# Patient Record
Sex: Female | Born: 1995 | Race: Black or African American | Hispanic: No | Marital: Single
Health system: Southern US, Community
[De-identification: ages and names within clinical notes are randomized; demographics above are authoritative.]

---

## 2018-11-10 ENCOUNTER — Emergency Department (HOSPITAL_COMMUNITY): Payer: Medicaid Other

## 2018-11-10 ENCOUNTER — Ambulatory Visit (HOSPITAL_COMMUNITY)
Admission: EM | Admit: 2018-11-10 | Discharge: 2018-11-10 | Disposition: A | Discharge status: Home / Self Care | Payer: Medicaid Other

## 2018-11-10 ENCOUNTER — Encounter (HOSPITAL_COMMUNITY): Payer: Self-pay | Admitting: *Deleted

## 2018-11-10 ENCOUNTER — Other Ambulatory Visit: Payer: Self-pay

## 2018-11-10 ENCOUNTER — Emergency Department (HOSPITAL_COMMUNITY)
Admission: EM | Admit: 2018-11-10 | Discharge: 2018-11-10 | Disposition: A | Discharge status: Home / Self Care | Payer: Medicaid Other | Attending: Emergency Medicine | Admitting: Emergency Medicine

## 2018-11-10 DIAGNOSIS — N390 Urinary tract infection, site not specified: Secondary | ICD-10-CM | POA: Insufficient documentation

## 2018-11-10 DIAGNOSIS — R102 Pelvic and perineal pain: Secondary | ICD-10-CM | POA: Diagnosis present

## 2018-11-10 LAB — CBC WITH DIFFERENTIAL/PLATELET
Abs Immature Granulocytes: 0.01 10*3/uL (ref 0.00–0.07)
BASOS ABS: 0 10*3/uL (ref 0.0–0.1)
Basophils Relative: 1 %
EOS ABS: 0.1 10*3/uL (ref 0.0–0.5)
Eosinophils Relative: 1 %
HCT: 44.4 % (ref 36.0–46.0)
Hemoglobin: 13.7 g/dL (ref 12.0–15.0)
Immature Granulocytes: 0 %
Lymphocytes Relative: 38 %
Lymphs Abs: 2.5 10*3/uL (ref 0.7–4.0)
MCH: 25 pg — ABNORMAL LOW (ref 26.0–34.0)
MCHC: 30.9 g/dL (ref 30.0–36.0)
MCV: 81.2 fL (ref 80.0–100.0)
Monocytes Absolute: 0.4 10*3/uL (ref 0.1–1.0)
Monocytes Relative: 6 %
Neutro Abs: 3.5 10*3/uL (ref 1.7–7.7)
Neutrophils Relative %: 54 %
PLATELETS: 182 10*3/uL (ref 150–400)
RBC: 5.47 MIL/uL — ABNORMAL HIGH (ref 3.87–5.11)
RDW: 19.6 % — AB (ref 11.5–15.5)
WBC: 6.5 10*3/uL (ref 4.0–10.5)
nRBC: 0 % (ref 0.0–0.2)

## 2018-11-10 LAB — COMPREHENSIVE METABOLIC PANEL
ALT: 17 U/L (ref 0–44)
AST: 23 U/L (ref 15–41)
Albumin: 3.9 g/dL (ref 3.5–5.0)
Alkaline Phosphatase: 61 U/L (ref 38–126)
Anion gap: 7 (ref 5–15)
BUN: 10 mg/dL (ref 6–20)
CO2: 25 mmol/L (ref 22–32)
Calcium: 9.4 mg/dL (ref 8.9–10.3)
Chloride: 106 mmol/L (ref 98–111)
Creatinine, Ser: 0.9 mg/dL (ref 0.44–1.00)
GFR calc non Af Amer: >60 mL/min (ref >60)
Glucose, Bld: 103 mg/dL — ABNORMAL HIGH (ref 70–99)
Potassium: 3.9 mmol/L (ref 3.5–5.1)
Sodium: 138 mmol/L (ref 135–145)
Total Bilirubin: 0.6 mg/dL (ref 0.3–1.2)
Total Protein: 6.9 g/dL (ref 6.5–8.1)

## 2018-11-10 LAB — WET PREP, GENITAL
Clue Cells Wet Prep HPF POC: NONE SEEN
Sperm: NONE SEEN
Trich, Wet Prep: NONE SEEN
Yeast Wet Prep HPF POC: NONE SEEN

## 2018-11-10 LAB — URINALYSIS, ROUTINE W REFLEX MICROSCOPIC
Bilirubin Urine: NEGATIVE
GLUCOSE, UA: NEGATIVE mg/dL
Ketones, ur: NEGATIVE mg/dL
Leukocytes, UA: NEGATIVE
Nitrite: NEGATIVE
Protein, ur: NEGATIVE mg/dL
RBC / HPF: >50 RBC/hpf — ABNORMAL HIGH (ref 0–5)
Specific Gravity, Urine: 1.011 (ref 1.005–1.030)
pH: 7 (ref 5.0–8.0)

## 2018-11-10 LAB — I-STAT BETA HCG BLOOD, ED (MC, WL, AP ONLY): I-stat hCG, quantitative: <5 m[IU]/mL (ref <5)

## 2018-11-10 MED ORDER — DOXYCYCLINE HYCLATE 100 MG PO CAPS: 100.0000 mg | ORAL_CAPSULE | Freq: Two times a day (BID) | ORAL | 0 refills | Status: AC

## 2018-11-10 MED ORDER — CEPHALEXIN 500 MG PO CAPS: 500.0000 mg | ORAL_CAPSULE | Freq: Four times a day (QID) | ORAL | 0 refills | Status: AC

## 2018-11-10 MED ORDER — SODIUM CHLORIDE 0.9 % IV BOLUS
1000.0000 mL | Freq: Once | INTRAVENOUS | Status: AC
  Administered 2018-11-10: 1000 mL via INTRAVENOUS

## 2018-11-10 NOTE — ED Notes (Signed)
Please note: urine culture collected and sent down in addition to UA. Also note that Pt is experiencing vaginal bleeding which has affected the color of the urine.

## 2018-11-10 NOTE — Discharge Instructions (Addendum)
Return if any problems.

## 2018-11-10 NOTE — ED Triage Notes (Signed)
Pt seen three days ago in the ED for lower abdominal pain and vomiting.  She was treated for Cystitis.  Pt has been taking Keflex x2 days, 4 doses.  She has not had any today.  She states the pain in her abdomen is not getting better and she is unable to walk without a lot of pain.

## 2018-11-10 NOTE — ED Triage Notes (Signed)
Pt in c/o lower abdominal pain, seen at urgent care a few days ago and dx with pyelo, states vomiting has improved but this morning her pain was significantly worse and is worse with movement, no distress noted

## 2018-11-10 NOTE — ED Notes (Signed)
Patient transported to [facility] 

## 2018-11-10 NOTE — ED Notes (Signed)
Pt seen by Dr. Milus Glazier and he has determined that the pt needs to be seen in the ED for lower abdominal pain that makes it difficult for her to even walk.  Pt is A&O w/ VSS and is with a friend.  Pt will be taken to ED by her.

## 2018-11-10 NOTE — ED Provider Notes (Signed)
[facility]   CSN: [id number] Arrival date & time: 11/10/18  1152     History   Chief Complaint Chief Complaint  Patient presents with  . Abdominal Pain    HPI Rhonda Mata is a 23 y.o. female who presents to ED for evaluation of 1 week history of lower abdominal pain, nausea.  She was seen and evaluated at outside emergency room on 11/07/2018 and diagnosed with UTI/pyelonephritis.  She has been taking her antibiotics as prescribed but states that she woke up with worsening of her lower abdominal pain.  States that the pain is worse with walking, palpation and movement.   She has not had a normal period since then.  She did have a normal bowel movement yesterday.  No sick contacts with similar symptoms.  Denies any fever, prior abdominal surgeries. Patient had spontaneous vaginal delivery of her child on 09/11/2019.  Since then she has had some light spotting but began having worsening bleeding 2 days ago.  States that the bleeding was brownish color but then she had increased bleeding this morning which included bright red blood.  States that this is increased from the amount of bleeding she has associated with her normal menstrual cycle.  HPI  History reviewed. No pertinent past medical history.  There are no active problems to display for this patient.    OB History   No obstetric history on file.      Home Medications    Prior to Admission medications   Not on File    Family History History reviewed. No pertinent family history.  Social History Social History   Tobacco Use  . Smoking status: Not on file  Substance Use Topics  . Alcohol use: Not on file  . Drug use: Not on file     Allergies   Patient has no known allergies.   Review of Systems Review of Systems  Constitutional: Negative for appetite change, chills and fever.  HENT: Negative for ear pain, rhinorrhea, sneezing and sore throat.     Eyes: Negative for photophobia and visual disturbance.  Respiratory: Negative for cough, chest tightness, shortness of breath and wheezing.   Cardiovascular: Negative for chest pain and palpitations.  Gastrointestinal: Positive for abdominal pain. Negative for blood in stool, constipation, diarrhea, nausea and vomiting.  Genitourinary: Positive for vaginal bleeding. Negative for dysuria, hematuria and urgency.  Musculoskeletal: Negative for myalgias.  Skin: Negative for rash.  Neurological: Negative for dizziness, weakness and light-headedness.     Physical Exam Updated Vital Signs BP 115/67 (BP Location: Left Arm)   Pulse (!) 58   Temp 97.9 F (36.6 C) (Oral)   Resp 18   SpO2 99%   Physical Exam Vitals signs and nursing note reviewed.  Constitutional:      General: She is not in acute distress.    Appearance: She is well-developed.  HENT:     Head: Normocephalic and atraumatic.     Nose: Nose normal.  Eyes:     General: No scleral icterus.       Left eye: No discharge.     Conjunctiva/sclera: Conjunctivae normal.  Neck:     Musculoskeletal: Normal range of motion and neck supple.  Cardiovascular:     Rate and Rhythm: Normal rate and regular rhythm.     Heart sounds: Normal heart sounds. No murmur. No friction rub. No gallop.   Pulmonary:     Effort: Pulmonary effort is normal. No respiratory distress.  Breath sounds: Normal breath sounds.  Abdominal:     General: Bowel sounds are normal. There is no distension.     Palpations: Abdomen is soft.     Tenderness: There is abdominal tenderness in the right lower quadrant, suprapubic area and left lower quadrant. There is no guarding.  Genitourinary:    Vagina: Bleeding present.     Cervix: No cervical motion tenderness.     Uterus: Not tender.      Adnexa:        Right: Tenderness present.      Comments: Pelvic exam: normal external genitalia without evidence of trauma. VULVA: normal appearing vulva with no masses,  tenderness or lesion. VAGINA: normal appearing vagina with normal color and discharge; bright red blood noted in vaginal vault. CERVIX: normal appearing cervix without lesions, cervical motion tenderness absent, cervical os closed with out purulent discharge; No vaginal discharge. Wet prep and DNA probe for chlamydia and GC obtained.   ADNEXA: normal adnexa in size, nontender and no masses UTERUS: uterus is normal size, shape, consistency and nontender.   Musculoskeletal: Normal range of motion.  Skin:    General: Skin is warm and dry.     Findings: No rash.  Neurological:     Mental Status: She is alert.     Motor: No abnormal muscle tone.     Coordination: Coordination normal.      ED Treatments / Results  Labs (all labs ordered are listed, but only abnormal results are displayed) Labs Reviewed  WET PREP, GENITAL - Abnormal; Notable for the following components:      Result Value   WBC, Wet Prep HPF POC MODERATE (*)    All other components within normal limits  COMPREHENSIVE METABOLIC PANEL - Abnormal; Notable for the following components:   Glucose, Bld 103 (*)    All other components within normal limits  CBC WITH DIFFERENTIAL/PLATELET - Abnormal; Notable for the following components:   RBC 5.47 (*)    MCH 25.0 (*)    RDW 19.6 (*)    All other components within normal limits  URINALYSIS, ROUTINE W REFLEX MICROSCOPIC - Abnormal; Notable for the following components:   Hgb urine dipstick LARGE (*)    RBC / HPF >50 (*)    Bacteria, UA RARE (*)    All other components within normal limits  I-STAT BETA HCG BLOOD, ED ([facility], [facility], [facility] ONLY)  GC/CHLAMYDIA PROBE AMP ([facility]) NOT AT [facility]    EKG None  Radiology No results found.  Procedures Procedures (including critical care time)  Medications Ordered in ED Medications  sodium chloride 0.9 % bolus 1,000 mL (1,000 mLs Intravenous New Bag/Given 11/10/18 1455)     Initial Impression / Assessment and Plan / ED Course  I  have reviewed the triage vital signs and the nursing notes.  Pertinent labs & imaging results that were available during my care of the patient were reviewed by me and considered in my medical decision making (see chart for details).     23 year old female who is 2 months postpartum from SVD, presents to ED for 1 week history of lower abdominal pain and nausea.  She was seen and evaluated on 11/07/2018 and diagnosed with UTI/pyelonephritis.  Despite taking antibiotics, she states that she has had worsening of her lower abdominal pain.  She does have some abnormal vaginal bleeding that began 2 days ago.  She has not had a normal menstrual cycle since her delivery.  She states that the  amount of bleeding is actually more than her usual.  Before her pregnancy.  She had a normal bowel movement yesterday.  On my exam no tenderness palpation of the lower abdomen without rebound or guarding.  Pelvic exam revealed large amount of bright red blood in the vaginal vault, closed cervical loss.  There is some right adnexal tenderness noted.  No discharge noted.  Plan is to obtain lab work, ultrasound and reassess.  3:10 PM CBC, CMP, hCG unremarkable.  Wet prep shows WBCs.  Urinalysis shows RBC.  Ultrasound pending at this time. Care handed off to oncoming provider [place].     Portions of this note were generated with Scientist, clinical (histocompatibility and immunogenetics)Dragon dictation software. Dictation errors may occur despite best attempts at proofreading.   Final Clinical Impressions(s) / ED Diagnoses   Final diagnoses:  None    ED Discharge Orders    None       Dietrich PatesKhatri, Brehanna Deveny, PA-C 11/10/18 1516    Doug SouJacubowitz, Sam, MD 11/10/18 1956

## 2018-11-11 LAB — GC/CHLAMYDIA PROBE AMP (~~LOC~~) NOT AT ARMC
Chlamydia: NEGATIVE
Neisseria Gonorrhea: NEGATIVE

## 2020-05-11 IMAGING — US US TRANSVAGINAL NON-OB
1 series · 13 of 25 positions shown · non-contrast
Comparison: None.

CLINICAL DATA: Acute right-sided pelvic pain.

EXAM:
TRANSABDOMINAL AND TRANSVAGINAL ULTRASOUND OF PELVIS
DOPPLER ULTRASOUND OF OVARIES
TECHNIQUE: Both transabdominal and transvaginal ultrasound examinations of the
pelvis were performed. Transabdominal technique was performed for
global imaging of the pelvis including uterus, ovaries, adnexal
regions, and pelvic cul-de-sac.
It was necessary to proceed with endovaginal exam following the
transabdominal exam to visualize the endometrium and ovaries. Color
and duplex Doppler ultrasound was utilized to evaluate blood flow to
the ovaries.

[Series 1: us transvaginal non-ob · 13 of 79 slices shown]
[im 1/79]
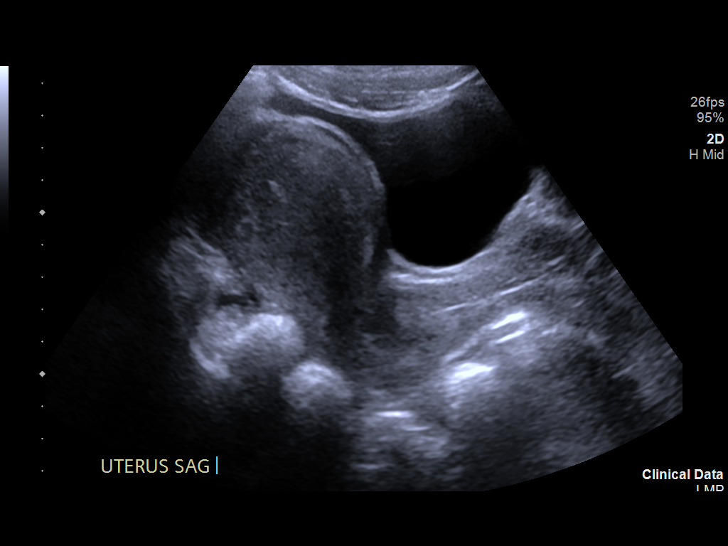
[im 7/79]
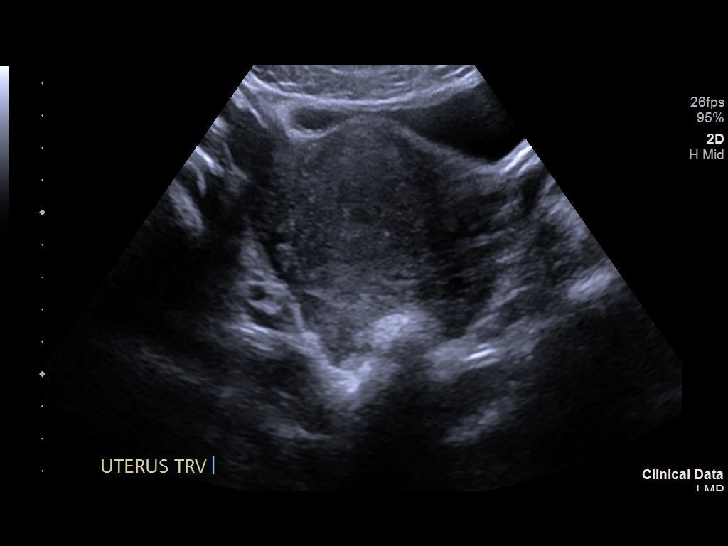
[im 14/79]
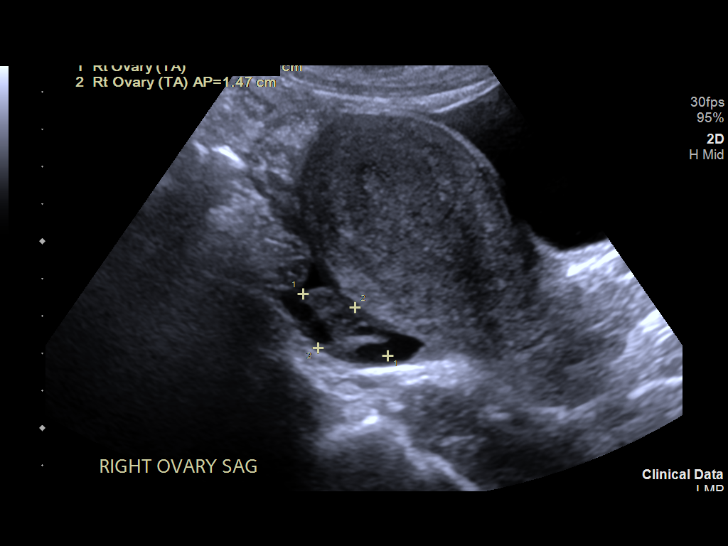
[im 20/79]
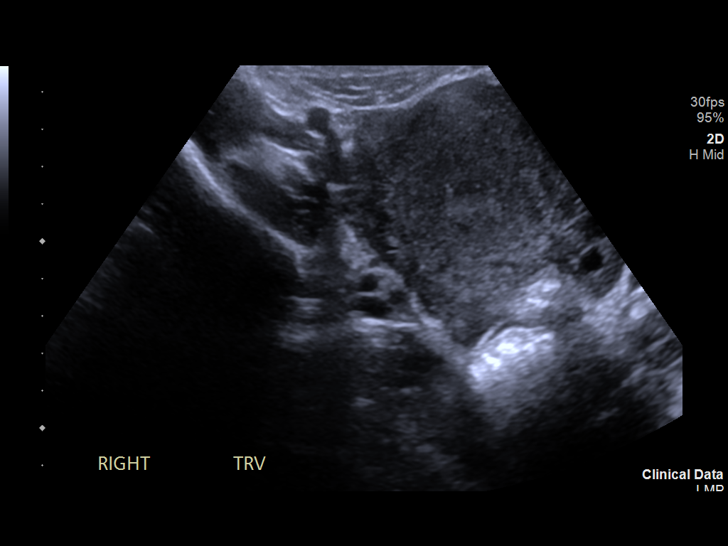
[im 27/79]
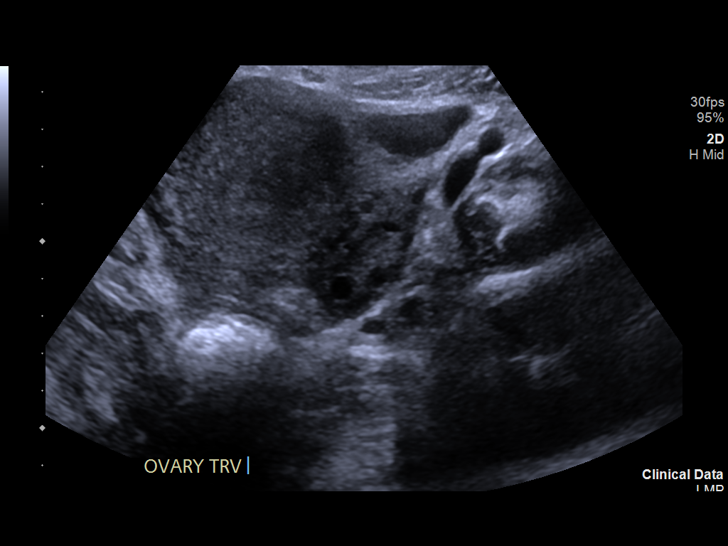
[im 33/79]
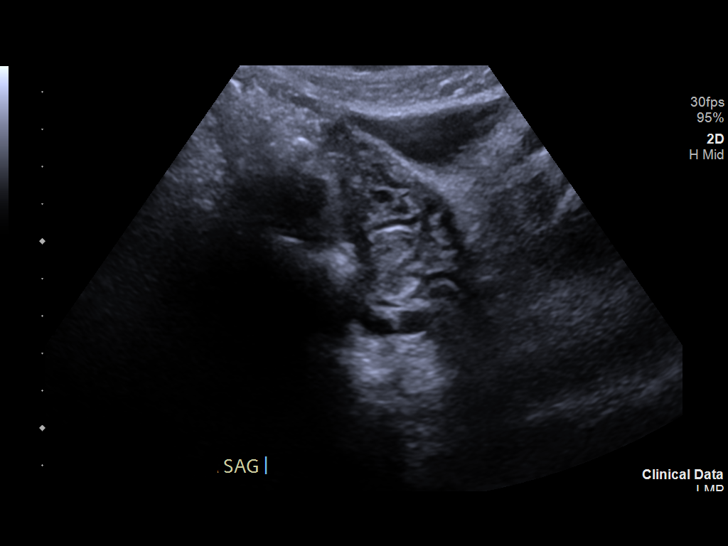
[im 40/79]
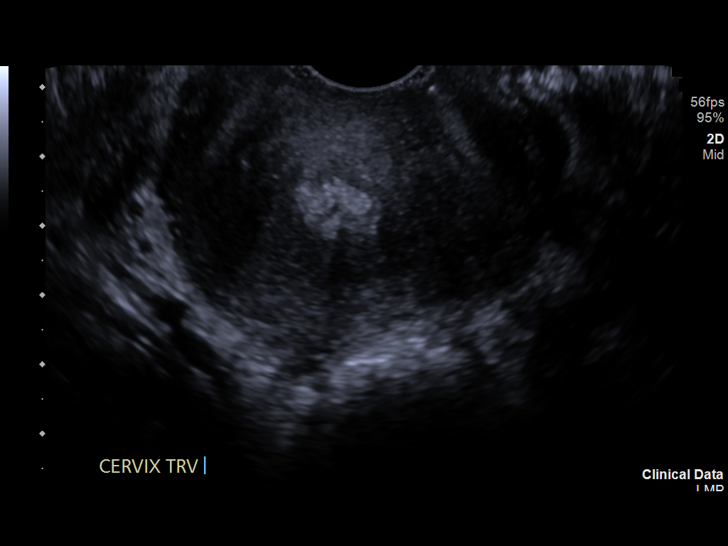
[im 46/79]
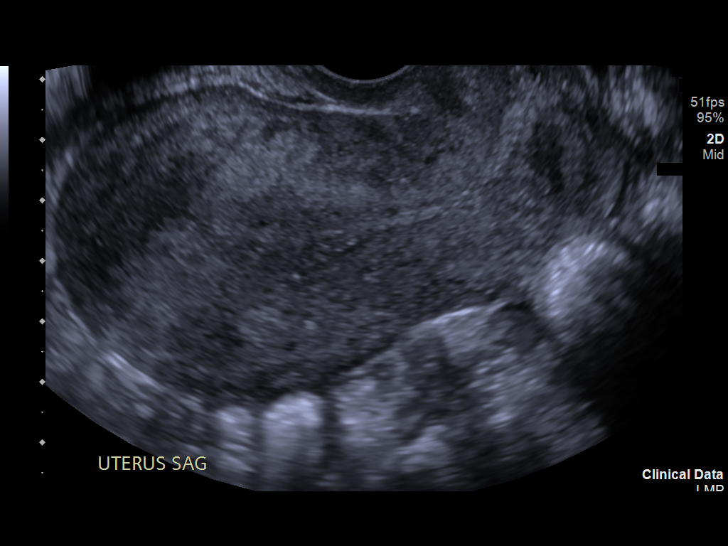
[im 53/79]
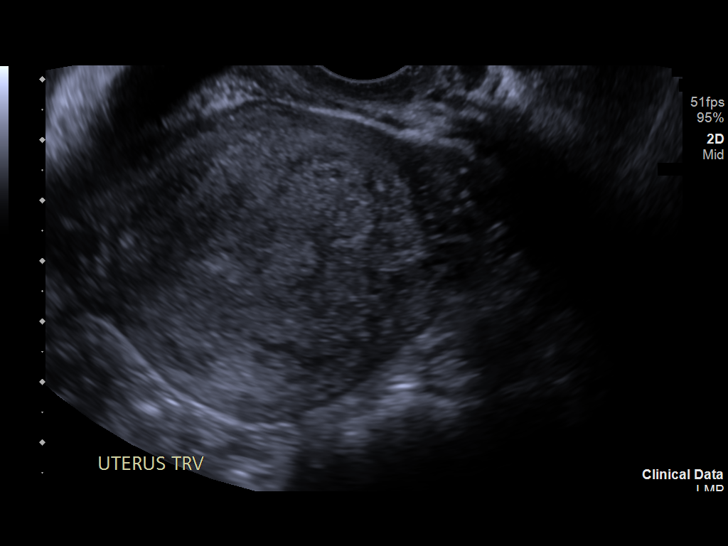
[im 59/79]
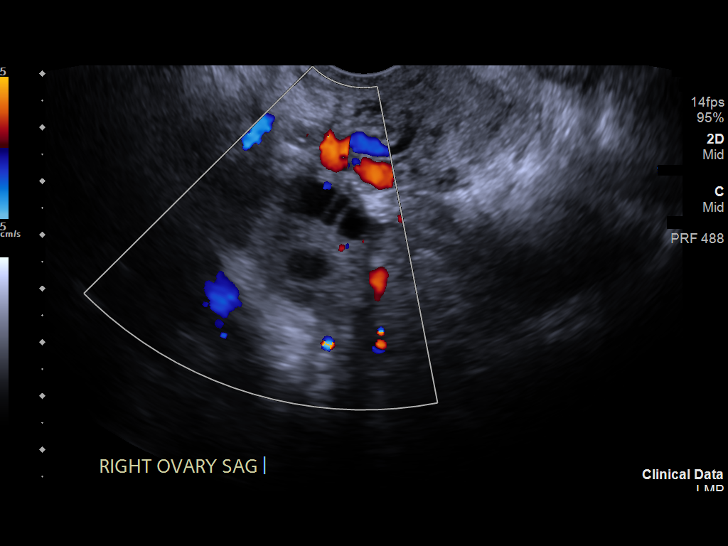
[im 66/79]
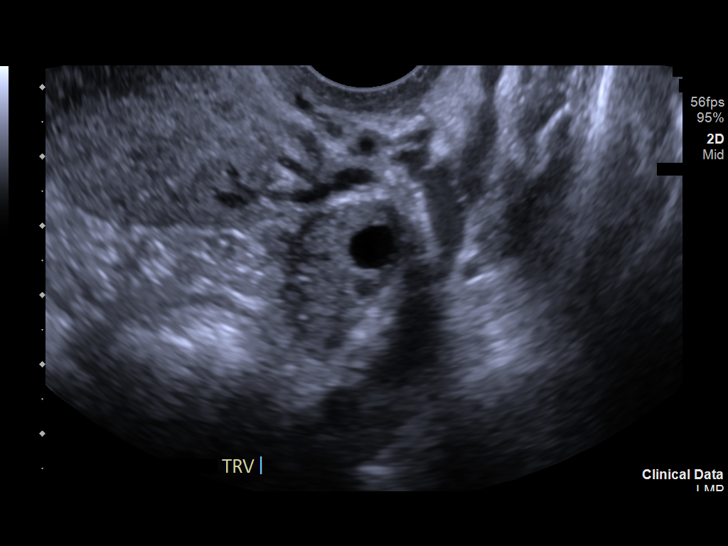
[im 72/79]
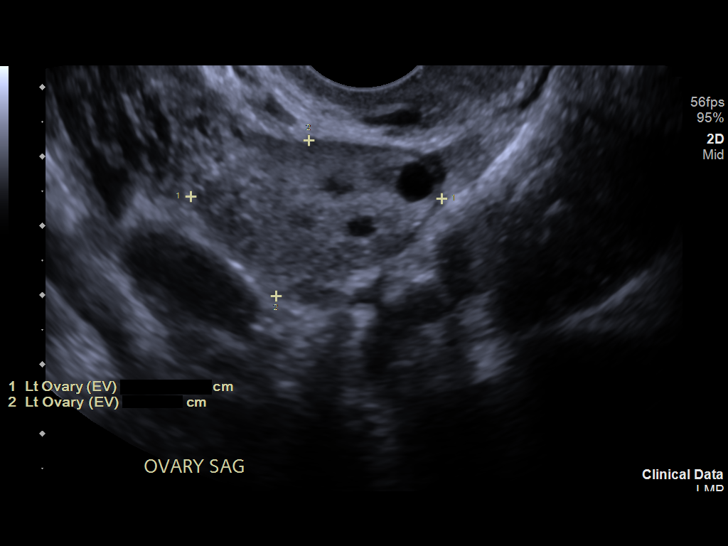
[im 79/79]
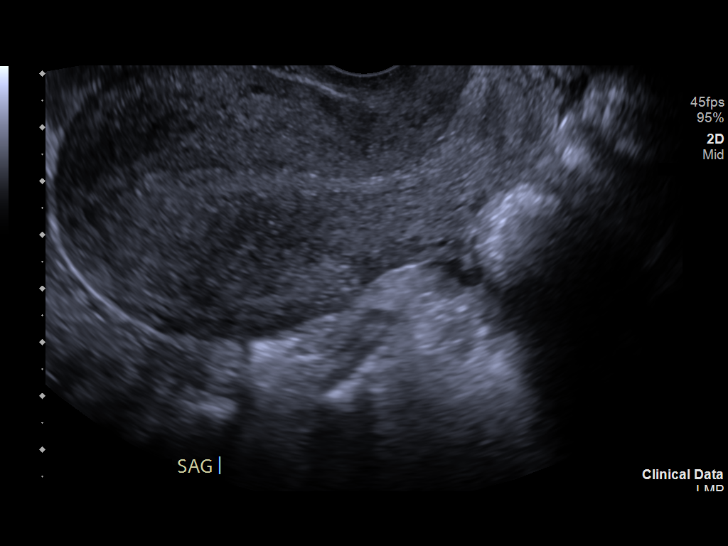

[13 of 25 positions shown; findings below may reference images not displayed]

FINDINGS: Uterus

Measurements: 9.3 x 6.2 x 4.9 cm = volume: 146 mL. No fibroids or
other mass visualized.

Endometrium

Thickness: 7 mm which is within normal limits. No focal abnormality
visualized.

Right ovary

Measurements: 3.4 x 2.5 x 2.2 cm = volume: 9 mL. Normal
appearance/no adnexal mass.

Left ovary

Measurements: 3.6 x 2.3 x 1.9 cm = volume: 8 mL. Normal
appearance/no adnexal mass.

Pulsed Doppler evaluation of both ovaries demonstrates normal
low-resistance arterial and venous waveforms.

Other findings

Trace free fluid is noted which most likely is physiologic.
IMPRESSION: No significant abnormality seen in the pelvis.
